# Patient Record
Sex: Female | Born: 1995 | Race: Black or African American | Hispanic: No | Marital: Single | Smoking: Never smoker
Health system: Southern US, Community
[De-identification: ages and names within clinical notes are randomized; demographics above are authoritative.]

## PROBLEM LIST (undated history)

## (undated) ENCOUNTER — Emergency Department: Discharge status: Absent without leave | Payer: Medicaid Other

## (undated) HISTORY — PX: NO PAST SURGERIES: SHX2092

---
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
## 2016-03-11 ENCOUNTER — Emergency Department: Payer: Self-pay

## 2016-03-11 ENCOUNTER — Encounter: Payer: Self-pay | Admitting: Emergency Medicine

## 2016-03-11 ENCOUNTER — Emergency Department
Admission: EM | Admit: 2016-03-11 | Discharge: 2016-03-12 | Disposition: A | Discharge status: Home / Self Care | Payer: Self-pay | Attending: Emergency Medicine | Admitting: Emergency Medicine

## 2016-03-11 DIAGNOSIS — R102 Pelvic and perineal pain: Secondary | ICD-10-CM

## 2016-03-11 DIAGNOSIS — B9689 Other specified bacterial agents as the cause of diseases classified elsewhere: Secondary | ICD-10-CM

## 2016-03-11 DIAGNOSIS — N760 Acute vaginitis: Secondary | ICD-10-CM | POA: Insufficient documentation

## 2016-03-11 LAB — URINALYSIS, COMPLETE (UACMP) WITH MICROSCOPIC
BILIRUBIN URINE: NEGATIVE
Bacteria, UA: NONE SEEN
GLUCOSE, UA: NEGATIVE mg/dL
HGB URINE DIPSTICK: NEGATIVE
Ketones, ur: 20 mg/dL — AB
LEUKOCYTES UA: NEGATIVE
NITRITE: NEGATIVE
PH: 5 (ref 5.0–8.0)
Protein, ur: 100 mg/dL — AB
SPECIFIC GRAVITY, URINE: 1.019 (ref 1.005–1.030)

## 2016-03-11 LAB — COMPREHENSIVE METABOLIC PANEL
ALT: 17 U/L (ref 14–54)
AST: 28 U/L (ref 15–41)
Albumin: 4.3 g/dL (ref 3.5–5.0)
Alkaline Phosphatase: 55 U/L (ref 38–126)
Anion gap: 9 (ref 5–15)
BILIRUBIN TOTAL: 1 mg/dL (ref 0.3–1.2)
BUN: 7 mg/dL (ref 6–20)
CHLORIDE: 103 mmol/L (ref 101–111)
CO2: 23 mmol/L (ref 22–32)
CREATININE: 0.64 mg/dL (ref 0.44–1.00)
Calcium: 9.3 mg/dL (ref 8.9–10.3)
GFR calc Af Amer: >60 mL/min (ref >60)
Glucose, Bld: 114 mg/dL — ABNORMAL HIGH (ref 65–99)
POTASSIUM: 3.5 mmol/L (ref 3.5–5.1)
Sodium: 135 mmol/L (ref 135–145)
TOTAL PROTEIN: 8 g/dL (ref 6.5–8.1)

## 2016-03-11 LAB — CBC
HCT: 33.4 % — ABNORMAL LOW (ref 35.0–47.0)
Hemoglobin: 11.5 g/dL — ABNORMAL LOW (ref 12.0–16.0)
MCH: 29.8 pg (ref 26.0–34.0)
MCHC: 34.4 g/dL (ref 32.0–36.0)
MCV: 86.8 fL (ref 80.0–100.0)
PLATELETS: 311 10*3/uL (ref 150–440)
RBC: 3.85 MIL/uL (ref 3.80–5.20)
RDW: 13.3 % (ref 11.5–14.5)
WBC: 12.4 10*3/uL — AB (ref 3.6–11.0)

## 2016-03-11 LAB — POCT PREGNANCY, URINE: PREG TEST UR: NEGATIVE

## 2016-03-11 LAB — LIPASE, BLOOD: LIPASE: 21 U/L (ref 11–51)

## 2016-03-11 MED ORDER — IOPAMIDOL (ISOVUE-300) INJECTION 61%
100.0000 mL | Freq: Once | INTRAVENOUS | Status: AC | PRN
  Administered 2016-03-11: 100 mL via INTRAVENOUS

## 2016-03-11 MED ORDER — ONDANSETRON HCL 4 MG/2ML IJ SOLN
4.0000 mg | Freq: Once | INTRAMUSCULAR | Status: DC
Start: 2016-03-11 — End: 2016-03-12

## 2016-03-11 MED ORDER — IOPAMIDOL (ISOVUE-300) INJECTION 61%
30.0000 mL | Freq: Once | INTRAVENOUS | Status: AC | PRN
  Administered 2016-03-11: 30 mL via INTRAVENOUS

## 2016-03-11 MED ORDER — KETOROLAC TROMETHAMINE 30 MG/ML IJ SOLN: 15.0000 mg | Freq: Once | INTRAMUSCULAR | Status: DC

## 2016-03-11 NOTE — ED Provider Notes (Signed)
Time Seen: Approximately 2148  I have reviewed the triage notes  Chief Complaint: Abdominal Pain   History of Present Illness: Alison Hall is a 21 y.o. female who presents with lower middle quadrant abdominal pain with some nausea and constipation over the past week. The patient states that she thought she might be pregnant. She has had some vaginal discharge which she describes as white and not atypical. She is not aware of any fever at home but did notice that the pain seemed to be worse when she was doing outside exercises. She denies any dysuria, hematuria or urinary frequency.   History reviewed. No pertinent past medical history.  There are no active problems to display for this patient.   History reviewed. No pertinent surgical history.  History reviewed. No pertinent surgical history.    Allergies:  Patient has no known allergies.  Family History: History reviewed. No pertinent family history.  Social History: Social History  Substance Use Topics  . Smoking status: Never Smoker  . Smokeless tobacco: Never Used  . Alcohol use No     Review of Systems:   10 point review of systems was performed and was otherwise negative:  Constitutional: No fever Eyes: No visual disturbances ENT: No sore throat, ear pain Cardiac: No chest pain Respiratory: No shortness of breath, wheezing, or stridor Abdomen:A she points mainly to the lower middle quadrant. Endocrine: No weight loss, No night sweats Extremities: No peripheral edema, cyanosis Skin: No rashes, easy bruising Neurologic: No focal weakness, trouble with speech or swollowing Urologic: No dysuria, Hematuria, or urinary frequency   Physical Exam:  ED Triage Vitals  Enc Vitals Group     BP 03/11/16 2028 116/66     Pulse Rate 03/11/16 2028 90     Resp 03/11/16 2028 16     Temp 03/11/16 2028 98 F (36.7 C)     Temp Source 03/11/16 2028 Oral     SpO2 03/11/16 2028 98 %     Weight 03/11/16 2028 182  lb (82.6 kg)     Height 03/11/16 2028 5\' 4"  (1.626 m)     Head Circumference --      Peak Flow --      Pain Score 03/11/16 2200 8     Pain Loc --      Pain Edu? --      Excl. in GC? --     General: Awake , Alert , and Oriented times 3; GCS 15 Head: Normal cephalic , atraumatic Eyes: Pupils equal , round, reactive to light Nose/Throat: No nasal drainage, patent upper airway without erythema or exudate.  Neck: Supple, Full range of motion, No anterior adenopathy or palpable thyroid masses Lungs: Clear to ascultation without wheezes , rhonchi, or rales Heart: Regular rate, regular rhythm without murmurs , gallops , or rubs Abdomen: Mild tenderness over the lower middle quadrant without rebound, guarding or rigidity .        Extremities: 2 plus symmetric pulses. No edema, clubbing or cyanosis Neurologic: normal ambulation, Motor symmetric without deficits, sensory intact Skin: warm, dry, no rashes Awake exam with chaperone present shows no cervical motion tenderness with a white to clear vaginal discharge. No purulence is noted wet prep and GC and chlamydia were obtained  Labs:   All laboratory work was reviewed including any pertinent negatives or positives listed below:  Labs Reviewed  COMPREHENSIVE METABOLIC PANEL - Abnormal; Notable for the following:       Result Value  Glucose, Bld 114 (*)    All other components within normal limits  CBC - Abnormal; Notable for the following:    WBC 12.4 (*)    Hemoglobin 11.5 (*)    HCT 33.4 (*)    All other components within normal limits  URINALYSIS, COMPLETE (UACMP) WITH MICROSCOPIC - Abnormal; Notable for the following:    Color, Urine YELLOW (*)    APPearance CLEAR (*)    Ketones, ur 20 (*)    Protein, ur 100 (*)    Squamous Epithelial / LPF 0-5 (*)    All other components within normal limits  CHLAMYDIA/NGC RT PCR ([facility] ONLY)  WET PREP, GENITAL  LIPASE, BLOOD  POC URINE PREG, ED  POCT PREGNANCY, URINE     Radiology: "Ct Abdomen Pelvis W Contrast  Result Date: 03/11/2016 CLINICAL DATA:  Left lower quadrant abdominal pain for 1 week. Nausea and constipation. EXAM: CT ABDOMEN AND PELVIS WITH CONTRAST TECHNIQUE: Multidetector CT imaging of the abdomen and pelvis was performed using the standard protocol following bolus administration of intravenous contrast. CONTRAST:  100mL ISOVUE-300 IOPAMIDOL (ISOVUE-300) INJECTION 61% COMPARISON:  None. FINDINGS: Lower chest: Lung bases are clear. Hepatobiliary: No focal liver abnormality is seen. No gallstones, gallbladder wall thickening, or biliary dilatation. Pancreas: Unremarkable. No pancreatic ductal dilatation or surrounding inflammatory changes. Spleen: Normal in size without focal abnormality. Adrenals/Urinary Tract: No adrenal gland nodules. Kidneys are symmetrical. Homogeneous nephrograms. No hydronephrosis or hydroureter. Bladder wall is not thickened. No bladder filling defects. Stomach/Bowel: Stomach, small bowel, and colon are not abnormally distended and no wall thickening is appreciated. Contrast material flows through to the colon without evidence of bowel obstruction. Appendix is not identified. Vascular/Lymphatic: No significant vascular findings are present. No enlarged abdominal or pelvic lymph nodes. Reproductive: Uterus and bilateral adnexa are unremarkable. Other: No abdominal wall hernia or abnormality. No abdominopelvic ascites. Musculoskeletal: No acute or significant osseous findings. IMPRESSION: No acute process demonstrated in the abdomen or pelvis. No evidence of bowel obstruction or inflammation. Electronically Signed   By: Burman NievesWilliam  Stevens M.D.   On: 03/11/2016 23:48  "  I personally reviewed the radiologic studies     ED Course:  Patient's stay here was uneventful and she seemed to have significant relief when advised that her pregnancy test was negative. She does not have any peritoneal signs on exam though her pain seemed to be  worse with movement and she has a slightly elevated non-shifted white blood cell count. Mental CT shows no acute process is noted and there is no findings indicating a surgical abdomen. The patient's wet prep is pending at this time though I did not have a strong clinical suspicion for gonorrhea or chlamydia or other sexually transmitted diseases     Assessment:  Acute unspecified lower middle abdominal pain      Plan:  Outpatient Patient was advised to return immediately if condition worsens. Patient was advised to follow up with their primary care physician or other specialized physicians involved in their outpatient care. The patient and/or family member/power of attorney had laboratory results reviewed at the bedside. All questions and concerns were addressed and appropriate discharge instructions were distributed by the nursing staff.             Jennye MoccasinBrian S Quigley, MD 03/11/16 [id number]

## 2016-03-11 NOTE — ED Triage Notes (Signed)
Pt ambulatory to triage with steady gait, no distress noted. Pt c/o LQ abdominal pain x1 week, nausea and constipation accompanying symptoms. Pt sts "I am scared I may be pregnant but I have been to scared to take a test."

## 2016-03-12 LAB — CHLAMYDIA/NGC RT PCR (ARMC ONLY)
CHLAMYDIA TR: NOT DETECTED
N gonorrhoeae: NOT DETECTED

## 2016-03-12 LAB — WET PREP, GENITAL
Sperm: NONE SEEN
TRICH WET PREP: NONE SEEN
YEAST WET PREP: NONE SEEN

## 2016-03-12 MED ORDER — METRONIDAZOLE 500 MG PO TABS: 500.0000 mg | ORAL_TABLET | Freq: Two times a day (BID) | ORAL | 0 refills | Status: DC

## 2016-03-12 NOTE — ED Provider Notes (Signed)
-----------------------------------------   12:42 AM on 03/12/2016 -----------------------------------------  Wet prep is positive for clue cells. GC/Chlamydia pending; previous provider did not suspect STDs.  CT abdomen/pelvis interpreted per Dr. Andria MeuseStevens: No acute process demonstrated in the abdomen or pelvis. No evidence  of bowel obstruction or inflammation.   Updated patient of results. Will place her on Flagyl and she will follow up with GYN. Strict return precautions given. Patient verbalizes understanding and agrees with plan of care.   Irean HongJade J Zoraya Fiorenza, MD 03/12/16 [phone]

## 2016-03-12 NOTE — Discharge Instructions (Signed)
1. Take antibiotic as prescribed (Flagyl 500 mg twice daily ×7 days). °2. Return to the ER for worsening symptoms, persistent vomiting, difficulty breathing or other concerns. °

## 2016-10-09 ENCOUNTER — Emergency Department
Admission: EM | Admit: 2016-10-09 | Discharge: 2016-10-09 | Disposition: A | Discharge status: Home / Self Care | Payer: Self-pay | Attending: Student in an Organized Health Care Education/Training Program | Admitting: Student in an Organized Health Care Education/Training Program

## 2016-10-09 ENCOUNTER — Encounter: Payer: Self-pay | Admitting: Emergency Medicine

## 2016-10-09 DIAGNOSIS — R509 Fever, unspecified: Secondary | ICD-10-CM

## 2016-10-09 DIAGNOSIS — R51 Headache: Secondary | ICD-10-CM

## 2016-10-09 DIAGNOSIS — R519 Headache, unspecified: Secondary | ICD-10-CM

## 2016-10-09 DIAGNOSIS — N12 Tubulo-interstitial nephritis, not specified as acute or chronic: Secondary | ICD-10-CM | POA: Insufficient documentation

## 2016-10-09 DIAGNOSIS — Z79899 Other long term (current) drug therapy: Secondary | ICD-10-CM | POA: Insufficient documentation

## 2016-10-09 LAB — URINALYSIS, COMPLETE (UACMP) WITH MICROSCOPIC
BILIRUBIN URINE: NEGATIVE
Glucose, UA: NEGATIVE mg/dL
KETONES UR: 80 mg/dL — AB
NITRITE: NEGATIVE
PH: 6 (ref 5.0–8.0)
PROTEIN: 100 mg/dL — AB
Specific Gravity, Urine: 1.024 (ref 1.005–1.030)

## 2016-10-09 LAB — COMPREHENSIVE METABOLIC PANEL
ALT: 20 U/L (ref 14–54)
AST: 35 U/L (ref 15–41)
Albumin: 3.8 g/dL (ref 3.5–5.0)
Alkaline Phosphatase: 64 U/L (ref 38–126)
Anion gap: 9 (ref 5–15)
BUN: 9 mg/dL (ref 6–20)
CALCIUM: 9.2 mg/dL (ref 8.9–10.3)
CHLORIDE: 102 mmol/L (ref 101–111)
CO2: 25 mmol/L (ref 22–32)
CREATININE: 1.01 mg/dL — AB (ref 0.44–1.00)
Glucose, Bld: 106 mg/dL — ABNORMAL HIGH (ref 65–99)
Potassium: 3.6 mmol/L (ref 3.5–5.1)
Sodium: 136 mmol/L (ref 135–145)
Total Bilirubin: 1.9 mg/dL — ABNORMAL HIGH (ref 0.3–1.2)
Total Protein: 8.1 g/dL (ref 6.5–8.1)

## 2016-10-09 LAB — CBC WITH DIFFERENTIAL/PLATELET
BASOS PCT: 0 %
Basophils Absolute: 0 10*3/uL (ref 0–0.1)
Eosinophils Absolute: 0 10*3/uL (ref 0–0.7)
Eosinophils Relative: 0 %
HCT: 36.8 % (ref 35.0–47.0)
HEMOGLOBIN: 12.6 g/dL (ref 12.0–16.0)
LYMPHS ABS: 1.1 10*3/uL (ref 1.0–3.6)
Lymphocytes Relative: 14 %
MCH: 30.1 pg (ref 26.0–34.0)
MCHC: 34.1 g/dL (ref 32.0–36.0)
MCV: 88.1 fL (ref 80.0–100.0)
Monocytes Absolute: 0.9 10*3/uL (ref 0.2–0.9)
Monocytes Relative: 12 %
NEUTROS PCT: 74 %
Neutro Abs: 5.5 10*3/uL (ref 1.4–6.5)
Platelets: 180 10*3/uL (ref 150–440)
RBC: 4.18 MIL/uL (ref 3.80–5.20)
RDW: 13.2 % (ref 11.5–14.5)
WBC: 7.4 10*3/uL (ref 3.6–11.0)

## 2016-10-09 LAB — PREGNANCY, URINE: Preg Test, Ur: NEGATIVE

## 2016-10-09 LAB — LIPASE, BLOOD: Lipase: 17 U/L (ref 11–51)

## 2016-10-09 LAB — LACTIC ACID, PLASMA: LACTIC ACID, VENOUS: 1.1 mmol/L (ref 0.5–1.9)

## 2016-10-09 MED ORDER — CEPHALEXIN 500 MG PO CAPS: 500.0000 mg | ORAL_CAPSULE | Freq: Three times a day (TID) | ORAL | 0 refills | Status: AC

## 2016-10-09 MED ORDER — CEPHALEXIN 500 MG PO CAPS
500.0000 mg | ORAL_CAPSULE | Freq: Once | ORAL | Status: AC
  Administered 2016-10-09: 500 mg via ORAL
  Filled 2016-10-09: qty 1

## 2016-10-09 MED ORDER — SODIUM CHLORIDE 0.9 % IV BOLUS (SEPSIS)
1000.0000 mL | Freq: Once | INTRAVENOUS | Status: AC
  Administered 2016-10-09: 1000 mL via INTRAVENOUS

## 2016-10-09 MED ORDER — PROCHLORPERAZINE EDISYLATE 5 MG/ML IJ SOLN
10.0000 mg | Freq: Once | INTRAMUSCULAR | Status: AC
  Administered 2016-10-09: 10 mg via INTRAVENOUS
  Filled 2016-10-09: qty 2

## 2016-10-09 MED ORDER — ACETAMINOPHEN 325 MG PO TABS
650.0000 mg | ORAL_TABLET | Freq: Once | ORAL | Status: AC
  Administered 2016-10-09: 650 mg via ORAL
  Filled 2016-10-09: qty 2

## 2016-10-09 NOTE — ED Provider Notes (Signed)
[facility] Emergency Department Provider Note    First MD Initiated Contact with Patient 10/09/16 1802     (approximate)  I have reviewed the triage vital signs and the nursing notes.   HISTORY  Chief Complaint Headache    HPI Alison Hall is a 21 y.o. female assessment chief complaint of 3 days of mild headache and myalgias. Has not had any measured fevers but was febrile in triage today. States that she has been having some left flank pain and dysuria starting 1 week ago.no recent travel. Denies any chance of being pregnant. Denies any vaginal discharge. No history of sexually transmitted infections. No cough. No sore throat. No blurry vision or photophobia.   History reviewed. No pertinent past medical history. History reviewed. No pertinent family history. History reviewed. No pertinent surgical history. There are no active problems to display for this patient.     Prior to Admission medications   Medication Sig Start Date End Date Taking? Authorizing Provider  metroNIDAZOLE (FLAGYL) 500 MG tablet Take 1 tablet (500 mg total) by mouth 2 (two) times daily. Patient not taking: Reported on 10/09/2016 03/12/16   Irean Hong, MD    Allergies Patient has no known allergies.    Social History Social History  Substance Use Topics  . Smoking status: Never Smoker  . Smokeless tobacco: Never Used  . Alcohol use No    Review of Systems Patient denies headaches, rhinorrhea, blurry vision, numbness, shortness of breath, chest pain, edema, cough, abdominal pain, nausea, vomiting, diarrhea, dysuria, fevers, rashes or hallucinations unless otherwise stated above in HPI. ____________________________________________   PHYSICAL EXAM:  VITAL SIGNS: Vitals:   10/09/16 1715  BP: 108/61  Pulse: (!) 105  Resp: 18  Temp: (!) 102 F (38.9 C)  SpO2: 99%    Constitutional: Alert and oriented. Well appearing and in no acute distress. Eyes:  Conjunctivae are normal.  Head: Atraumatic. Nose: No congestion/rhinnorhea. Mouth/Throat: Mucous membranes are moist.   Neck: No stridor. Painless Hall.  Cardiovascular: Normal rate, regular rhythm. Grossly normal heart sounds.  Good peripheral circulation. Respiratory: Normal respiratory effort.  No retractions. Lungs CTAB. Gastrointestinal: Soft and nontender. No distention. No abdominal bruits. + left CVA tenderness. Musculoskeletal: No lower extremity tenderness nor edema.  No joint effusions. Neurologic:  Normal speech and language. No gross focal neurologic deficits are appreciated. No facial droop Skin:  Skin is warm, dry and intact. No rash noted. Psychiatric: Mood and affect are normal. Speech and behavior are normal.  ____________________________________________   LABS (all labs ordered are listed, but only abnormal results are displayed)  Results for orders placed or performed during the hospital encounter of 10/09/16 (from the past 24 hour(s))  Lactic acid, plasma     Status: None   Collection Time: 10/09/16  5:16 PM  Result Value Ref Range   Lactic Acid, Venous 1.1 0.5 - 1.9 mmol/L  Comprehensive metabolic panel     Status: Abnormal   Collection Time: 10/09/16  5:16 PM  Result Value Ref Range   Sodium 136 135 - 145 mmol/L   Potassium 3.6 3.5 - 5.1 mmol/L   Chloride 102 101 - 111 mmol/L   CO2 25 22 - 32 mmol/L   Glucose, Bld 106 (H) 65 - 99 mg/dL   BUN 9 6 - 20 mg/dL   Creatinine, Ser 4.09 (H) 0.44 - 1.00 mg/dL   Calcium 9.2 8.9 - 81.1 mg/dL   Total Protein 8.1 6.5 - 8.1 g/dL  Albumin 3.8 3.5 - 5.0 g/dL   AST 35 15 - 41 U/L   ALT 20 14 - 54 U/L   Alkaline Phosphatase 64 38 - 126 U/L   Total Bilirubin 1.9 (H) 0.3 - 1.2 mg/dL   GFR calc non Af Amer >60 >60 mL/min   GFR calc Af Amer >60 >60 mL/min   Anion gap 9 5 - 15  CBC with Differential     Status: None   Collection Time: 10/09/16  5:16 PM  Result Value Ref Range   WBC 7.4 3.6 - 11.0 K/uL   RBC 4.18 3.80 -  5.20 MIL/uL   Hemoglobin 12.6 12.0 - 16.0 g/dL   HCT 16.1 09.6 - 04.5 %   MCV 88.1 80.0 - 100.0 fL   MCH 30.1 26.0 - 34.0 pg   MCHC 34.1 32.0 - 36.0 g/dL   RDW 40.9 81.1 - 91.4 %   Platelets 180 150 - 440 K/uL   Neutrophils Relative % 74 %   Neutro Abs 5.5 1.4 - 6.5 K/uL   Lymphocytes Relative 14 %   Lymphs Abs 1.1 1.0 - 3.6 K/uL   Monocytes Relative 12 %   Monocytes Absolute 0.9 0.2 - 0.9 K/uL   Eosinophils Relative 0 %   Eosinophils Absolute 0.0 0 - 0.7 K/uL   Basophils Relative 0 %   Basophils Absolute 0.0 0 - 0.1 K/uL  Lipase, blood     Status: None   Collection Time: 10/09/16  5:22 PM  Result Value Ref Range   Lipase 17 11 - 51 U/L  Urinalysis, Complete w Microscopic     Status: Abnormal   Collection Time: 10/09/16  7:17 PM  Result Value Ref Range   Color, Urine AMBER (A) YELLOW   APPearance HAZY (A) CLEAR   Specific Gravity, Urine 1.024 1.005 - 1.030   pH 6.0 5.0 - 8.0   Glucose, UA NEGATIVE NEGATIVE mg/dL   Hgb urine dipstick MODERATE (A) NEGATIVE   Bilirubin Urine NEGATIVE NEGATIVE   Ketones, ur 80 (A) NEGATIVE mg/dL   Protein, ur 782 (A) NEGATIVE mg/dL   Nitrite NEGATIVE NEGATIVE   Leukocytes, UA SMALL (A) NEGATIVE   RBC / HPF 6-30 0 - 5 RBC/hpf   WBC, UA TOO NUMEROUS TO COUNT 0 - 5 WBC/hpf   Bacteria, UA RARE (A) NONE SEEN   Squamous Epithelial / LPF 0-5 (A) NONE SEEN   Mucus PRESENT   Pregnancy, urine     Status: None   Collection Time: 10/09/16  7:17 PM  Result Value Ref Range   Preg Test, Ur NEGATIVE NEGATIVE   ____________________________________________ ____________________________________________  RADIOLOGY  I personally reviewed all radiographic images ordered to evaluate for the above acute complaints and reviewed radiology reports and findings.  These findings were personally discussed with the patient.  Please see medical record for radiology report.  ____________________________________________   PROCEDURES  Procedure(s) performed:    Procedures    Critical Care performed: no ____________________________________________   INITIAL IMPRESSION / ASSESSMENT AND PLAN / ED COURSE  Pertinent labs & imaging results that were available during my care of the patient were reviewed by me and considered in my medical decision making (see chart for details).  DDX: pyelo, uti, sti, strep throat, unlikely meningitis  Alison Hall is a 21 y.o. who presents to the ED with headache dysuria or flank pain. Patient is febrile mildly tachycardic but very well perfused and well appearing. Patient smiling in ER room and walked in without  any distress. She has no meningismus or focal neurodeficits. Her abdominal exam is soft and benign. Do not believe this is clinically consistent with appendicitis or acute intra-abdominal process however I am concerned for pyelonephritis. Will provide IV fluids headache cocktail and blood work for the above differential.  Clinical Course as of Oct 10 2002  Fri Oct 09, 2016  1957 patient reassessed. States headache has resolved. Based on her urinalysis and do believe this is most consistent with urinary tract infection. Repeat abdominal exam is soft and benign. She does have some flank tenderness will treat this as a pyelonephritis with Keflex. Discussed strict return precautions. Patient able to tolerate oral hydration and is otherwise asymptomatic at this time.  [PR]    Clinical Course User Index [PR] Willy Eddy, MD     ____________________________________________   FINAL CLINICAL IMPRESSION(S) / ED DIAGNOSES  Final diagnoses:  Fever, unspecified fever cause  Pyelonephritis  Acute nonintractable headache, unspecified headache type      NEW MEDICATIONS STARTED DURING THIS VISIT:  New Prescriptions   No medications on file     Note:  This document was prepared using Dragon voice recognition software and may include unintentional dictation errors.    Willy Eddy,  MD 10/09/16 2004

## 2016-10-09 NOTE — ED Triage Notes (Addendum)
Pt c/o headache for 3 days that has been constant.  Head hurts when turns neck but not really pain with extension/flexion.  Fever in triage.  Has also had some lower left abdominal pain.  Ambulatory to triage. alert and oriented X3.  NAD. Respirations unlabored. Is texting on phone looking down.   Mask applied

## 2016-10-09 NOTE — Discharge Instructions (Signed)
Return immediately for worsening pain, inability to tolerate antibiotics or for any other concerns or questions.

## 2017-04-14 ENCOUNTER — Encounter: Payer: Self-pay | Admitting: Advanced Practice Midwife

## 2019-04-20 ENCOUNTER — Ambulatory Visit: Payer: Self-pay | Attending: Family

## 2019-04-20 DIAGNOSIS — Z23 Encounter for immunization: Secondary | ICD-10-CM

## 2019-04-20 NOTE — Progress Notes (Signed)
   Covid-19 Vaccination Clinic  Name:  Alison Hall    MRN: [id number] DOB: 10-Feb-1995  04/20/2019  Ms. Gathright was observed post Covid-19 immunization for 15 minutes without incident. She was provided with Vaccine Information Sheet and instruction to access the V-Safe system.   Ms. Melder was instructed to call 911 with any severe reactions post vaccine: Marland Kitchen Difficulty breathing  . Swelling of face and throat  . A fast heartbeat  . A bad rash all over body  . Dizziness and weakness   Immunizations Administered    Name Date Dose VIS Date Route   Moderna COVID-19 Vaccine 04/20/2019  5:39 PM 0.5 mL 12/27/2018 Intramuscular   Manufacturer: Moderna   Lot: [id number]   NDC: 37543-606-77

## 2019-05-23 ENCOUNTER — Ambulatory Visit: Payer: Medicaid Other | Attending: Family

## 2019-05-23 ENCOUNTER — Ambulatory Visit: Payer: Self-pay

## 2019-05-23 DIAGNOSIS — Z23 Encounter for immunization: Secondary | ICD-10-CM

## 2019-05-23 NOTE — Progress Notes (Signed)
   Covid-19 Vaccination Clinic  Name:  LAIKYNN POLLIO    MRN: [id number] DOB: 1995/10/25  05/23/2019  Ms. Hallberg was observed post Covid-19 immunization for 15 minutes without incident. She was provided with Vaccine Information Sheet and instruction to access the V-Safe system.   Ms. Mensinger was instructed to call 911 with any severe reactions post vaccine: Marland Kitchen Difficulty breathing  . Swelling of face and throat  . A fast heartbeat  . A bad rash all over body  . Dizziness and weakness   Immunizations Administered    Name Date Dose VIS Date Route   Moderna COVID-19 Vaccine 05/23/2019  4:14 PM 0.5 mL 12/2018 Intramuscular   Manufacturer: Moderna   Lot: [id number]   NDC: 20910-681-66

## 2019-08-05 ENCOUNTER — Other Ambulatory Visit: Payer: Self-pay

## 2019-08-05 ENCOUNTER — Emergency Department
Admission: EM | Admit: 2019-08-05 | Discharge: 2019-08-05 | Disposition: A | Discharge status: Home / Self Care | Payer: BC Managed Care – PPO | Attending: Emergency Medicine | Admitting: Emergency Medicine

## 2019-08-05 DIAGNOSIS — S0083XA Contusion of other part of head, initial encounter: Secondary | ICD-10-CM

## 2019-08-05 DIAGNOSIS — W268XXA Contact with other sharp object(s), not elsewhere classified, initial encounter: Secondary | ICD-10-CM | POA: Insufficient documentation

## 2019-08-05 DIAGNOSIS — S0181XA Laceration without foreign body of other part of head, initial encounter: Secondary | ICD-10-CM

## 2019-08-05 DIAGNOSIS — S0993XA Unspecified injury of face, initial encounter: Secondary | ICD-10-CM | POA: Diagnosis present

## 2019-08-05 DIAGNOSIS — S01112A Laceration without foreign body of left eyelid and periocular area, initial encounter: Secondary | ICD-10-CM | POA: Insufficient documentation

## 2019-08-05 DIAGNOSIS — Y999 Unspecified external cause status: Secondary | ICD-10-CM | POA: Diagnosis not present

## 2019-08-05 DIAGNOSIS — Y92009 Unspecified place in unspecified non-institutional (private) residence as the place of occurrence of the external cause: Secondary | ICD-10-CM | POA: Diagnosis not present

## 2019-08-05 DIAGNOSIS — Z79899 Other long term (current) drug therapy: Secondary | ICD-10-CM | POA: Diagnosis not present

## 2019-08-05 DIAGNOSIS — Y939 Activity, unspecified: Secondary | ICD-10-CM | POA: Insufficient documentation

## 2019-08-05 MED ORDER — LIDOCAINE-EPINEPHRINE-TETRACAINE (LET) TOPICAL GEL
3.0000 mL | Freq: Once | TOPICAL | Status: AC
  Administered 2019-08-05: 3 mL via TOPICAL
  Filled 2019-08-05: qty 3
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
## 2019-08-05 NOTE — ED Notes (Signed)
See triage note, pt is ambulatory to the room in no acute distress with small laceration below left eye with bruising.

## 2019-08-05 NOTE — Discharge Instructions (Addendum)
You may shower but do not submerge Dermabond underwater.  Do not place any ointments or creams over the glue.  Allow Dermabond to come off on its own, if still intact in 10 days, you may remove Dermabond.  Return to the ER for any warmth redness swelling or drainage.  Please continue with ice to minimize swelling.
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
## 2019-09-27 ENCOUNTER — Ambulatory Visit
Admission: EM | Admit: 2019-09-27 | Discharge: 2019-09-27 | Disposition: A | Discharge status: Home / Self Care | Payer: PRIVATE HEALTH INSURANCE | Attending: Family Medicine | Admitting: Family Medicine

## 2019-09-27 DIAGNOSIS — R112 Nausea with vomiting, unspecified: Secondary | ICD-10-CM | POA: Diagnosis not present

## 2019-09-27 DIAGNOSIS — R197 Diarrhea, unspecified: Secondary | ICD-10-CM | POA: Diagnosis not present

## 2019-09-27 DIAGNOSIS — Z1152 Encounter for screening for COVID-19: Secondary | ICD-10-CM

## 2019-09-27 DIAGNOSIS — R52 Pain, unspecified: Secondary | ICD-10-CM | POA: Diagnosis not present

## 2019-09-27 DIAGNOSIS — K529 Noninfective gastroenteritis and colitis, unspecified: Secondary | ICD-10-CM

## 2019-09-27 MED ORDER — ONDANSETRON HCL 4 MG PO TABS: 4.0000 mg | ORAL_TABLET | Freq: Four times a day (QID) | ORAL | 0 refills | Status: DC

## 2019-09-27 MED ORDER — LOPERAMIDE HCL 2 MG PO CAPS: 2.0000 mg | ORAL_CAPSULE | Freq: Four times a day (QID) | ORAL | 0 refills | Status: AC | PRN

## 2019-09-27 NOTE — ED Triage Notes (Signed)
Pt presents with diarrhea, nausea, abdominal discomfort and vomiting x 3 days. Denies fever, SOB.

## 2019-09-27 NOTE — ED Provider Notes (Signed)
[facility]   [id number] 09/27/19 Arrival Time: 1015   CC: COVID symptoms  SUBJECTIVE: History from: patient.  Alison Hall is a 24 y.o. female who presents with abrupt onset of nasal congestion, PND, nausea, vomiting, diarrhea for the last 3 days. Denies sick exposure to COVID, flu or strep. Denies recent travel. Has negative history of Covid. Has completed Covid vaccines. Has not taken OTC medications for this. There are no aggravating or alleviating factors. Denies previous symptoms in the past. Denies fever, chills, fatigue, sinus pain, rhinorrhea, sore throat, SOB, wheezing, chest pain, changes in bowel or bladder habits.    ROS: As per HPI.  All other pertinent ROS negative.     No past medical history on file. No past surgical history on file. No Known Allergies No current facility-administered medications on file prior to encounter.   Current Outpatient Medications on File Prior to Encounter  Medication Sig Dispense Refill   metroNIDAZOLE (FLAGYL) 500 MG tablet Take 1 tablet (500 mg total) by mouth 2 (two) times daily. (Patient not taking: Reported on 10/09/2016) 14 tablet 0   Social History   Socioeconomic History   Marital status: Single    Spouse name: Not on file   Number of children: Not on file   Years of education: Not on file   Highest education level: Not on file  Occupational History   Not on file  Tobacco Use   Smoking status: Never Smoker   Smokeless tobacco: Never Used  Substance and Sexual Activity   Alcohol use: No   Drug use: No   Sexual activity: Not on file  Other Topics Concern   Not on file  Social History Narrative   Not on file   Social Determinants of Health   Financial Resource Strain:    Difficulty of Paying Living Expenses: Not on file  Food Insecurity:    Worried About Running Out of Food in the Last Year: Not on file   Ran Out of Food in the Last Year: Not on file  Transportation Needs:    Lack of  Transportation (Medical): Not on file   Lack of Transportation (Non-Medical): Not on file  Physical Activity:    Days of Exercise per Week: Not on file   Minutes of Exercise per Session: Not on file  Stress:    Feeling of Stress : Not on file  Social Connections:    Frequency of Communication with Friends and Family: Not on file   Frequency of Social Gatherings with Friends and Family: Not on file   Attends Religious Services: Not on file   Active Member of Clubs or Organizations: Not on file   Attends Banker Meetings: Not on file   Marital Status: Not on file  Intimate Partner Violence:    Fear of Current or Ex-Partner: Not on file   Emotionally Abused: Not on file   Physically Abused: Not on file   Sexually Abused: Not on file   No family history on file.  OBJECTIVE:  Vitals:   09/27/19 1021  BP: 106/75  Pulse: 85  Resp: 17  Temp: 98.1 F (36.7 C)  TempSrc: Oral  SpO2: 97%     General appearance: alert; appears fatigued, but nontoxic; speaking in full sentences and tolerating own secretions HEENT: NCAT; Ears: EACs clear, TMs pearly gray; Eyes: PERRL.  EOM grossly intact. Sinuses: nontender; Nose: nares patent without rhinorrhea, Throat: oropharynx clear, tonsils non erythematous or enlarged, uvula midline  Neck: supple  without LAD Lungs: unlabored respirations, symmetrical air entry; cough: absent; no respiratory distress; CTAB Heart: regular rate and rhythm.  Radial pulses 2+ symmetrical bilaterally Skin: warm and dry Psychological: alert and cooperative; normal mood and affect  LABS:  No results found for this or any previous visit (from the past 24 hour(s)).   ASSESSMENT & PLAN:  1. Noninfectious gastroenteritis, unspecified type   2. Body aches   3. Nausea and vomiting, intractability of vomiting not specified, unspecified vomiting type   4. Diarrhea, unspecified type   5. Aches   6. Encounter for screening for COVID-19      Meds ordered this encounter  Medications   ondansetron (ZOFRAN) 4 MG tablet    Sig: Take 1 tablet (4 mg total) by mouth every 6 (six) hours.    Dispense:  12 tablet    Refill:  0    Order Specific Question:   Supervising Provider    Answer:   Merrilee Jansky [id number]   loperamide (IMODIUM) 2 MG capsule    Sig: Take 1 capsule (2 mg total) by mouth 4 (four) times daily as needed for diarrhea or loose stools.    Dispense:  12 capsule    Refill:  0    Order Specific Question:   Supervising Provider    Answer:   Merrilee Jansky [id number]    Prescribed zofran for nausea Prescribed immodium for diarrhea COVID testing ordered.  It will take between 1-2 days for test results.  Someone will contact you regarding abnormal results.    Patient should remain in quarantine until they have received Covid results.  If negative you may resume normal activities (go back to work/school) while practicing hand hygiene, social distance, and mask wearing.  If positive, patient should remain in quarantine for 10 days from symptom onset AND greater than 72 hours after symptoms resolution (absence of fever without the use of fever-reducing medication and improvement in respiratory symptoms), whichever is longer Get plenty of rest and push fluids Use OTC zyrtec for nasal congestion, runny nose, and/or sore throat Use OTC flonase for nasal congestion and runny nose Use medications daily for symptom relief Use OTC medications like ibuprofen or tylenol as needed fever or pain Call or go to the ED if you have any new or worsening symptoms such as fever, worsening cough, shortness of breath, chest tightness, chest pain, turning blue, changes in mental status.  Reviewed expectations re: course of current medical issues. Questions answered. Outlined signs and symptoms indicating need for more acute intervention. Patient verbalized understanding. After Visit Summary given.         [name],  NP 09/27/19 1054

## 2019-09-27 NOTE — Discharge Instructions (Addendum)
Your COVID test is pending.  You should self quarantine until the test result is back.    Loperamide for diarrhea  Zofran for nausea  Take Tylenol as needed for fever or discomfort.  Rest and keep yourself hydrated.    Go to the emergency department if you develop acute worsening symptoms.

## 2019-09-29 LAB — NOVEL CORONAVIRUS, NAA: SARS-CoV-2, NAA: NOT DETECTED

## 2019-10-04 ENCOUNTER — Ambulatory Visit (INDEPENDENT_AMBULATORY_CARE_PROVIDER_SITE_OTHER): Payer: PRIVATE HEALTH INSURANCE

## 2019-10-04 ENCOUNTER — Encounter: Payer: Self-pay | Admitting: Emergency Medicine

## 2019-10-04 ENCOUNTER — Ambulatory Visit
Admission: EM | Admit: 2019-10-04 | Discharge: 2019-10-04 | Disposition: A | Discharge status: Home / Self Care | Payer: PRIVATE HEALTH INSURANCE | Attending: Family Medicine | Admitting: Family Medicine

## 2019-10-04 ENCOUNTER — Other Ambulatory Visit: Payer: Self-pay

## 2019-10-04 DIAGNOSIS — M25562 Pain in left knee: Secondary | ICD-10-CM

## 2019-10-04 DIAGNOSIS — S83102A Unspecified subluxation of left knee, initial encounter: Secondary | ICD-10-CM | POA: Diagnosis not present

## 2019-10-04 DIAGNOSIS — M25462 Effusion, left knee: Secondary | ICD-10-CM

## 2019-10-04 MED ORDER — NAPROXEN 500 MG PO TABS: 500.0000 mg | ORAL_TABLET | Freq: Two times a day (BID) | ORAL | 0 refills | Status: AC

## 2019-10-04 NOTE — ED Provider Notes (Signed)
[facility]    CSN: [id number] Arrival date & time: 10/04/19  1716      History   Chief Complaint Chief Complaint  Patient presents with  . Knee Pain    left    HPI Alison Hall is a 24 y.o. female.   24 year old female presents for 2-week history of left knee pain and swelling that has been gradually worsening.  She denies any known inciting event.  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  Denies any work-related injury.  She says pain is up to about a 9 out of 10 currently.  She has been elevating the knee.  She has not taken anything for pain or ice the knee.  She denies any numbness tingling or weakness.  She denies any prior history of knee injury, surgery, ligament tears, etc.  She says the knee pain is all over the anterior knee and radiates down the medial lower leg.  Pain is worse with flexion and ambulation.  Denies falls.  Denies any other complaints.     History reviewed. No pertinent past medical history.  There are no problems to display for this patient.   Past Surgical History:  Procedure Laterality Date  . NO PAST SURGERIES      OB History   No obstetric history on file.      Home Medications    Prior to Admission medications   Medication Sig Start Date End Date Taking? Authorizing Provider  loperamide (IMODIUM) 2 MG capsule Take 1 capsule (2 mg total) by mouth 4 (four) times daily as needed for diarrhea or loose stools. 09/27/19   [name], NP  metroNIDAZOLE (FLAGYL) 500 MG tablet Take 1 tablet (500 mg total) by mouth 2 (two) times daily. Patient not taking: Reported on 10/09/2016 03/12/16   Irean Hong, MD  naproxen (NAPROSYN) 500 MG tablet Take 1 tablet (500 mg total) by mouth 2 (two) times daily for 15 days. 10/04/19 10/19/19  Eusebio Friendly B, PA-C  ondansetron (ZOFRAN) 4 MG tablet Take 1 tablet (4 mg total) by mouth every 6 (six) hours. 09/27/19   [name], NP    Family History Family History  Problem Relation  Age of Onset  . Diabetes Mother   . Other Father        unknown medical history    Social History Social History   Tobacco Use  . Smoking status: Never Smoker  . Smokeless tobacco: Never Used  Vaping Use  . Vaping Use: Never used  Substance Use Topics  . Alcohol use: No  . Drug use: No     Allergies   Patient has no known allergies.   Review of Systems Review of Systems  Constitutional: Negative for fatigue and fever.  Musculoskeletal: Positive for arthralgias, gait problem and joint swelling. Negative for myalgias.  Skin: Negative for rash and wound.  Neurological: Negative for weakness and numbness.     Physical Exam Triage Vital Signs ED Triage Vitals  Enc Vitals Group     BP 10/04/19 1805 108/72     Pulse Rate 10/04/19 1805 80     Resp 10/04/19 1805 18     Temp 10/04/19 1805 98.4 F (36.9 C)     Temp Source 10/04/19 1805 Oral     SpO2 10/04/19 1805 100 %     Weight 10/04/19 1806 228 lb (103.4 kg)     Height 10/04/19 1806 5' 4.5" (1.638 m)     Head Circumference --  Peak Flow --      Pain Score 10/04/19 1804 9     Pain Loc --      Pain Edu? --      Excl. in GC? --    No data found.  Updated Vital Signs BP 108/72 (BP Location: Left Arm)   Pulse 80   Temp 98.4 F (36.9 C) (Oral)   Resp 18   Ht 5' 4.5" (1.638 m)   Wt 228 lb (103.4 kg)   LMP 09/20/2019 (Approximate)   SpO2 100%   BMI 38.53 kg/m       Physical Exam Vitals and nursing note reviewed.  Constitutional:      General: She is not in acute distress.    Appearance: Normal appearance. She is not ill-appearing or toxic-appearing.  HENT:     Head: Normocephalic and atraumatic.  Eyes:     General: No scleral icterus.       Right eye: No discharge.        Left eye: No discharge.     Conjunctiva/sclera: Conjunctivae normal.  Cardiovascular:     Rate and Rhythm: Normal rate.     Pulses: Normal pulses.  Pulmonary:     Effort: Pulmonary effort is normal. No respiratory distress.    Musculoskeletal:     Cervical back: Neck supple.     Right knee: Normal.     Left knee: Swelling (moderate swelling anterior knee) and bony tenderness (patella) present. No ecchymosis. Decreased range of motion (decreased flexion beyond 90 degrees). Tenderness present over the medial joint line and lateral joint line.     Comments: 5/5 strength bilat knee flexion and extension  Skin:    General: Skin is dry.  Neurological:     General: No focal deficit present.     Mental Status: She is alert. Mental status is at baseline.     Motor: No weakness.     Gait: Gait abnormal.  Psychiatric:        Mood and Affect: Mood normal.        Behavior: Behavior normal.        Thought Content: Thought content normal.      UC Treatments / Results  Labs (all labs ordered are listed, but only abnormal results are displayed) Labs Reviewed - No data to display  EKG   Radiology DG Knee Complete 4 Views Left  Result Date: 10/04/2019 CLINICAL DATA:  Pain and swelling EXAM: LEFT KNEE - COMPLETE 4+ VIEW COMPARISON:  None. FINDINGS: Mild lateral subluxation of the patella. No fracture is seen. The joint spaces are maintained. Suspected small moderate knee effusion. IMPRESSION: Mild lateral subluxation of the patella with small to moderate knee effusion. Electronically Signed   By: Jasmine Pang M.D.   On: 10/04/2019 18:42    Procedures Procedures (including critical care time)  Medications Ordered in UC Medications - No data to display  Initial Impression / Assessment and Plan / UC Course  I have reviewed the triage vital signs and the nursing notes.  Pertinent labs & imaging results that were available during my care of the patient were reviewed by me and considered in my medical decision making (see chart for details).    Patient's x-ray consistent with mild subluxation of the patella of the left knee.  Patient denies any injury.  Advised her to try to get the swelling down with icing the knee  every couple of hours and taking NSAIDs.  She can also take Tylenol for pain  relief patient fitted with a neoprene hinged knee brace.  Work note given for couple of days.  Advised her that if she is not feeling better before she is supposed to go back to work she should follow-up with orthopedics for further imaging of the knee.  There is some concern that she could have torn a ligament or meniscus due to the significant swelling.  She should follow-up sooner with Ortho if any symptoms worsen including the pain or if she has any weakness or falls due to knee instability.  Patient understanding and agreeable.  Final Clinical Impressions(s) / UC Diagnoses   Final diagnoses:  Subluxation of left knee, initial encounter  Effusion of bursa of left knee  Acute pain of left knee     Discharge Instructions     At this time you should rest and ice the knee.  Take NSAIDs as prescribed.  He can also take Tylenol for pain relief.  Try to keep knee elevated and wear the brace.  If you are not feeling better over the next few days you should follow-up with orthopedics.  You have a condition requiring you to follow up with Orthopedics so please call one of the following office for appointment:   [facility] [address] [place], [place] [address] Phone: [phone]  [facility] [address], [place], [place] [address] Phone: [phone]     ED Prescriptions    Medication Sig Dispense Auth. Provider   naproxen (NAPROSYN) 500 MG tablet Take 1 tablet (500 mg total) by mouth 2 (two) times daily for 15 days. 30 tablet Gareth Morgan     PDMP not reviewed this encounter.   Eusebio Friendly B, PA-C 10/05/19 [phone]

## 2019-10-04 NOTE — ED Triage Notes (Signed)
Patient in today c/o left knee pain and swelling x 2 weeks, getting progressively worse. No injury noted. Patient has not iced her knee or taken any OTC medications.

## 2019-10-04 NOTE — Discharge Instructions (Signed)
At this time you should rest and ice the knee.  Take NSAIDs as prescribed.  He can also take Tylenol for pain relief.  Try to keep knee elevated and wear the brace.  If you are not feeling better over the next few days you should follow-up with orthopedics.  You have a condition requiring you to follow up with Orthopedics so please call one of the following office for appointment:   [facility] [address] [place], [place] [address] Phone: [phone]  [facility] [address], [place], [place] [address] Phone: [phone]

## 2019-10-20 ENCOUNTER — Ambulatory Visit
Admission: EM | Admit: 2019-10-20 | Discharge: 2019-10-20 | Disposition: A | Discharge status: Home / Self Care | Payer: PRIVATE HEALTH INSURANCE | Attending: Emergency Medicine | Admitting: Emergency Medicine

## 2019-10-20 DIAGNOSIS — Z113 Encounter for screening for infections with a predominantly sexual mode of transmission: Secondary | ICD-10-CM

## 2019-10-20 DIAGNOSIS — N898 Other specified noninflammatory disorders of vagina: Secondary | ICD-10-CM

## 2019-10-20 LAB — POCT URINALYSIS DIP (MANUAL ENTRY)
Bilirubin, UA: NEGATIVE
Glucose, UA: NEGATIVE mg/dL
Ketones, POC UA: NEGATIVE mg/dL
Leukocytes, UA: NEGATIVE
Nitrite, UA: NEGATIVE
Spec Grav, UA: >=1.03 — AB (ref 1.010–1.025)
Urobilinogen, UA: 0.2 E.U./dL
pH, UA: 6 (ref 5.0–8.0)

## 2019-10-20 LAB — POCT URINE PREGNANCY: Preg Test, Ur: NEGATIVE

## 2019-10-20 MED ORDER — CEFTRIAXONE SODIUM 500 MG IJ SOLR
500.0000 mg | Freq: Once | INTRAMUSCULAR | Status: AC
  Administered 2019-10-20: 500 mg via INTRAMUSCULAR

## 2019-10-20 MED ORDER — DOXYCYCLINE HYCLATE 100 MG PO CAPS: 100.0000 mg | ORAL_CAPSULE | Freq: Two times a day (BID) | ORAL | 0 refills | Status: DC

## 2019-10-20 MED ORDER — METRONIDAZOLE 500 MG PO TABS: 500.0000 mg | ORAL_TABLET | Freq: Two times a day (BID) | ORAL | 0 refills | Status: DC

## 2019-10-20 NOTE — ED Provider Notes (Signed)
Alison Hall    CSN: [id number] Arrival date & time: 10/20/19  8921      History   Chief Complaint Chief Complaint  Patient presents with  . Vaginal odor    HPI Alison Hall is a 24 y.o. female.   Presents with 2-week history of vaginal odor and small amount of yellow vaginal discharge.  She is sexually active and does not use condoms.  She denies fever, chills, rash, lesions, abdominal pain, dysuria, back pain, pelvic pain, or other symptoms.  No treatments attempted at home.  The history is provided by the patient.    History reviewed. No pertinent past medical history.  There are no problems to display for this patient.   Past Surgical History:  Procedure Laterality Date  . NO PAST SURGERIES      OB History   No obstetric history on file.      Home Medications    Prior to Admission medications   Medication Sig Start Date End Date Taking? Authorizing Provider  doxycycline (VIBRAMYCIN) 100 MG capsule Take 1 capsule (100 mg total) by mouth 2 (two) times daily for 7 days. 10/20/19 10/27/19  Mickie Bail, NP  loperamide (IMODIUM) 2 MG capsule Take 1 capsule (2 mg total) by mouth 4 (four) times daily as needed for diarrhea or loose stools. 09/27/19   [name], NP  metroNIDAZOLE (FLAGYL) 500 MG tablet Take 1 tablet (500 mg total) by mouth 2 (two) times daily. 10/20/19   Mickie Bail, NP  ondansetron (ZOFRAN) 4 MG tablet Take 1 tablet (4 mg total) by mouth every 6 (six) hours. 09/27/19   [name], NP    Family History Family History  Problem Relation Age of Onset  . Diabetes Mother   . Other Father        unknown medical history    Social History Social History   Tobacco Use  . Smoking status: Never Smoker  . Smokeless tobacco: Never Used  Vaping Use  . Vaping Use: Never used  Substance Use Topics  . Alcohol use: No  . Drug use: No     Allergies   Patient has no known allergies.   Review of Systems Review of Systems    Constitutional: Negative for chills and fever.  HENT: Negative for ear pain and sore throat.   Eyes: Negative for pain and visual disturbance.  Respiratory: Negative for cough and shortness of breath.   Cardiovascular: Negative for chest pain and palpitations.  Gastrointestinal: Negative for abdominal pain and vomiting.  Genitourinary: Positive for vaginal discharge. Negative for dysuria, flank pain, hematuria and pelvic pain.  Musculoskeletal: Negative for arthralgias and back pain.  Skin: Negative for color change and rash.  Neurological: Negative for seizures and syncope.  All other systems reviewed and are negative.    Physical Exam Triage Vital Signs ED Triage Vitals [10/20/19 0928]  Enc Vitals Group     BP      Pulse      Resp      Temp      Temp src      SpO2      Weight      Height      Head Circumference      Peak Flow      Pain Score 0     Pain Loc      Pain Edu?      Excl. in GC?    No data found.  Updated Vital Signs  BP 106/72   Pulse 95   Temp 98.1 F (36.7 C)   Resp 16   LMP 10/06/2019 (Exact Date)   SpO2 97%   Visual Acuity Right Eye Distance:   Left Eye Distance:   Bilateral Distance:    Right Eye Near:   Left Eye Near:    Bilateral Near:     Physical Exam Vitals and nursing note reviewed.  Constitutional:      General: She is not in acute distress.    Appearance: She is well-developed.  HENT:     Head: Normocephalic and atraumatic.     Mouth/Throat:     Mouth: Mucous membranes are moist.     Pharynx: Oropharynx is clear.  Eyes:     Conjunctiva/sclera: Conjunctivae normal.  Cardiovascular:     Rate and Rhythm: Normal rate and regular rhythm.     Heart sounds: No murmur heard.   Pulmonary:     Effort: Pulmonary effort is normal. No respiratory distress.     Breath sounds: Normal breath sounds.  Abdominal:     Palpations: Abdomen is soft.     Tenderness: There is no abdominal tenderness. There is no right CVA tenderness, left  CVA tenderness, guarding or rebound.  Musculoskeletal:     Cervical back: Neck supple.  Skin:    General: Skin is warm and dry.     Findings: No rash.  Neurological:     General: No focal deficit present.     Mental Status: She is alert and oriented to person, place, and time.     Gait: Gait normal.  Psychiatric:        Mood and Affect: Mood normal.        Behavior: Behavior normal.      UC Treatments / Results  Labs (all labs ordered are listed, but only abnormal results are displayed) Labs Reviewed  POCT URINALYSIS DIP (MANUAL ENTRY) - Abnormal; Notable for the following components:      Result Value   Spec Grav, UA >=1.030 (*)    Blood, UA trace-intact (*)    Protein Ur, POC trace (*)    All other components within normal limits  POCT URINE PREGNANCY  CERVICOVAGINAL ANCILLARY ONLY    EKG   Radiology No results found.  Procedures Procedures (including critical care time)  Medications Ordered in UC Medications  cefTRIAXone (ROCEPHIN) injection 500 mg (has no administration in time range)    Initial Impression / Assessment and Plan / UC Course  I have reviewed the triage vital signs and the nursing notes.  Pertinent labs & imaging results that were available during my care of the patient were reviewed by me and considered in my medical decision making (see chart for details).   Vaginal discharge.  Screening for STDs.  Patient obtained vaginal self swab for testing.  Treated with Rocephin here.  Instructed patient to take metronidazole and doxycycline as directed.  Instructed her to abstain from sexual activity for 7 days.  Discussed with patient that she and her sexual partner may require additional treatment if the test results are positive.  Patient agrees to plan of care.     Final Clinical Impressions(s) / UC Diagnoses   Final diagnoses:  Vaginal discharge  Screening examination for STD (sexually transmitted disease)     Discharge Instructions       You were treated with an antibiotic today called Rocephin.  Take metronidazole twice a day for 7 days and doxycycline twice a day for  7 days.    Do not have sex for 7 days.  Your vaginal tests are pending.  If your test results are positive, we will call you.  You may need additional treatment and your partner(s) may also need treatment.            ED Prescriptions    Medication Sig Dispense Auth. Provider   doxycycline (VIBRAMYCIN) 100 MG capsule Take 1 capsule (100 mg total) by mouth 2 (two) times daily for 7 days. 14 capsule Mickie Bail, NP   metroNIDAZOLE (FLAGYL) 500 MG tablet Take 1 tablet (500 mg total) by mouth 2 (two) times daily. 14 tablet Mickie Bail, NP     PDMP not reviewed this encounter.   Mickie Bail, NP 10/20/19 1001

## 2019-10-20 NOTE — ED Triage Notes (Signed)
Patient reports she noticed a vaginal odor 2 weeks ago. Believed it to be d/t her soap, but she switched products and the smell has not gone away. Also reports a small amount of discharge on toilet tissue after voiding.

## 2019-10-20 NOTE — Discharge Instructions (Addendum)
You were treated with an antibiotic today called Rocephin.  Take metronidazole twice a day for 7 days and doxycycline twice a day for 7 days.    Do not have sex for 7 days.  Your vaginal tests are pending.  If your test results are positive, we will call you.  You may need additional treatment and your partner(s) may also need treatment.       

## 2019-10-23 ENCOUNTER — Telehealth: Payer: Self-pay

## 2019-10-23 DIAGNOSIS — N760 Acute vaginitis: Secondary | ICD-10-CM

## 2019-10-23 LAB — CERVICOVAGINAL ANCILLARY ONLY
Bacterial Vaginitis (gardnerella): POSITIVE — AB
Candida Glabrata: NEGATIVE
Candida Vaginitis: NEGATIVE
Chlamydia: NEGATIVE
Comment: NEGATIVE
Comment: NEGATIVE
Comment: NEGATIVE
Comment: NEGATIVE
Comment: NEGATIVE
Comment: NORMAL
Neisseria Gonorrhea: NEGATIVE
Trichomonas: NEGATIVE

## 2019-10-23 MED ORDER — METRONIDAZOLE 500 MG PO TABS: 500.0000 mg | ORAL_TABLET | Freq: Two times a day (BID) | ORAL | 0 refills | Status: DC

## 2020-02-09 ENCOUNTER — Ambulatory Visit: Payer: PRIVATE HEALTH INSURANCE | Attending: Family

## 2020-02-09 DIAGNOSIS — Z23 Encounter for immunization: Secondary | ICD-10-CM

## 2020-04-08 ENCOUNTER — Ambulatory Visit
Admission: EM | Admit: 2020-04-08 | Discharge: 2020-04-08 | Disposition: A | Discharge status: Home / Self Care | Payer: PRIVATE HEALTH INSURANCE | Attending: Family Medicine | Admitting: Family Medicine

## 2020-04-08 ENCOUNTER — Other Ambulatory Visit: Payer: Self-pay

## 2020-04-08 ENCOUNTER — Ambulatory Visit: Payer: Self-pay

## 2020-04-08 DIAGNOSIS — N898 Other specified noninflammatory disorders of vagina: Secondary | ICD-10-CM

## 2020-04-08 MED ORDER — METRONIDAZOLE 500 MG PO TABS: 500.0000 mg | ORAL_TABLET | Freq: Two times a day (BID) | ORAL | 0 refills | Status: AC

## 2020-04-08 NOTE — ED Provider Notes (Signed)
[name]    CSN: [id number] Arrival date & time: 04/08/20  8182      History   Chief Complaint Chief Complaint  Patient presents with  . Vaginal Discharge    HPI Alison Hall is a 25 y.o. female.   Patient is a 25 year old female who presents today with yellow vaginal discharge, "fishy odor" in nature for approx 1 week. Pt states h/o BV. Denies vaginal bleeding, abdominal pain, dysuria sx, n/v/d, fever.  No OTC meds for symptoms   Vaginal Discharge   History reviewed. No pertinent past medical history.  There are no problems to display for this patient.   Past Surgical History:  Procedure Laterality Date  . NO PAST SURGERIES      OB History   No obstetric history on file.      Home Medications    Prior to Admission medications   Medication Sig Start Date End Date Taking? Authorizing Provider  metroNIDAZOLE (FLAGYL) 500 MG tablet Take 1 tablet (500 mg total) by mouth 2 (two) times daily. 04/08/20  Yes Gresham Caetano A, NP  Prenatal Vit-Fe Fumarate-FA (PRENATAL VITAMINS) 28-0.8 MG TABS Take 1 tablet by mouth daily. 03/14/20  Yes [provider]  loperamide (IMODIUM) 2 MG capsule Take 1 capsule (2 mg total) by mouth 4 (four) times daily as needed for diarrhea or loose stools. 09/27/19   [name], NP    Family History Family History  Problem Relation Age of Onset  . Diabetes Mother   . Other Father        unknown medical history    Social History Social History   Tobacco Use  . Smoking status: Never Smoker  . Smokeless tobacco: Never Used  Vaping Use  . Vaping Use: Never used  Substance Use Topics  . Alcohol use: No  . Drug use: No     Allergies   Patient has no known allergies.   Review of Systems Review of Systems  Genitourinary: Positive for vaginal discharge.     Physical Exam Triage Vital Signs ED Triage Vitals  Enc Vitals Group     BP 04/08/20 0920 109/63     Pulse Rate 04/08/20 0920 72     Resp  04/08/20 0920 18     Temp 04/08/20 0920 98.6 F (37 C)     Temp Source 04/08/20 0920 Oral     SpO2 04/08/20 0920 98 %     Weight --      Height --      Head Circumference --      Peak Flow --      Pain Score 04/08/20 0922 0     Pain Loc --      Pain Edu? --      Excl. in [place]? --    No data found.  Updated Vital Signs BP 109/63 (BP Location: Right Arm)   Pulse 72   Temp 98.6 F (37 C) (Oral)   Resp 18   LMP 03/21/2020   SpO2 98%   Visual Acuity Right Eye Distance:   Left Eye Distance:   Bilateral Distance:    Right Eye Near:   Left Eye Near:    Bilateral Near:     Physical Exam Vitals and nursing note reviewed.  Constitutional:      General: She is not in acute distress.    Appearance: Normal appearance. She is not ill-appearing, toxic-appearing or diaphoretic.  HENT:     Head: Normocephalic.  Eyes:  Conjunctiva/sclera: Conjunctivae normal.  Pulmonary:     Effort: Pulmonary effort is normal.  Musculoskeletal:        General: Normal range of motion.     Cervical back: Normal range of motion.  Skin:    General: Skin is warm and dry.     Findings: No rash.  Neurological:     Mental Status: She is alert.  Psychiatric:        Mood and Affect: Mood normal.      UC Treatments / Results  Labs (all labs ordered are listed, but only abnormal results are displayed) Labs Reviewed  CERVICOVAGINAL ANCILLARY ONLY    EKG   Radiology No results found.  Procedures Procedures (including critical care time)  Medications Ordered in UC Medications - No data to display  Initial Impression / Assessment and Plan / UC Course  I have reviewed the triage vital signs and the nursing notes.  Pertinent labs & imaging results that were available during my care of the patient were reviewed by me and considered in my medical decision making (see chart for details).     Vaginal discharge Treating for BV based on hx and symptoms.  [facility] sent for confirmation.   Follow up as needed for continued or worsening symptoms  Final Clinical Impressions(s) / UC Diagnoses   Final diagnoses:  Vaginal discharge     Discharge Instructions     Take the medication as prescribed Take with food.  Swab results pending.  Follow up as needed for continued or worsening symptoms     ED Prescriptions    Medication Sig Dispense Auth. Provider   metroNIDAZOLE (FLAGYL) 500 MG tablet Take 1 tablet (500 mg total) by mouth 2 (two) times daily. 14 tablet Davon Folta A, NP     PDMP not reviewed this encounter.   Janace Aris, NP 04/08/20 [phone]

## 2020-04-08 NOTE — Discharge Instructions (Addendum)
Take the medication as prescribed Take with food.  Swab results pending.  Follow up as needed for continued or worsening symptoms

## 2020-04-08 NOTE — ED Triage Notes (Signed)
Pt c/o yellow vaginal discharge, "fishy odor" in nature for approx 1 week. Pt states h/o BV.   Denies vaginal bleeding, abdominal pain, dysuria sx, n/v/d, fever.  No OTC meds for symptoms Denies likelihood of pregnancy

## 2020-04-09 LAB — CERVICOVAGINAL ANCILLARY ONLY
Bacterial Vaginitis (gardnerella): POSITIVE — AB
Candida Glabrata: NEGATIVE
Candida Vaginitis: NEGATIVE
Chlamydia: NEGATIVE
Comment: NEGATIVE
Comment: NEGATIVE
Comment: NEGATIVE
Comment: NEGATIVE
Comment: NEGATIVE
Comment: NORMAL
Neisseria Gonorrhea: NEGATIVE
Trichomonas: NEGATIVE

## 2020-06-10 NOTE — Progress Notes (Signed)
   Covid-19 Vaccination Clinic  Name:  [name]    MRN: [id number] DOB: [birth year]  06/10/2020  [name] was observed post Covid-19 immunization for 15 minutes without incident. She was provided with Vaccine Information Sheet and instruction to access the V-Safe system.   [name] was instructed to call 911 with any severe reactions post vaccine: Marland Kitchen Difficulty breathing  . Swelling of face and throat  . A fast heartbeat  . A bad rash all over body  . Dizziness and weakness   Immunizations Administered    Name Date Dose VIS Date Route   Moderna Covid-19 Booster Vaccine 02/09/2020 10:45 AM 0.25 mL 11/15/2019 Intramuscular   Manufacturer: Moderna   Lot: [id number]   NDC: 21308-657-84

## 2020-08-15 ENCOUNTER — Ambulatory Visit: Payer: Self-pay

## 2020-08-15 ENCOUNTER — Other Ambulatory Visit: Payer: Self-pay

## 2020-08-15 ENCOUNTER — Ambulatory Visit
Admission: EM | Admit: 2020-08-15 | Discharge: 2020-08-15 | Disposition: A | Discharge status: Home / Self Care | Payer: BC Managed Care – PPO | Attending: Family Medicine | Admitting: Family Medicine

## 2020-08-15 DIAGNOSIS — Z1152 Encounter for screening for COVID-19: Secondary | ICD-10-CM | POA: Diagnosis not present

## 2020-08-15 NOTE — Discharge Instructions (Signed)

## 2020-08-15 NOTE — ED Triage Notes (Signed)
Pt had a +home covid test. Not having symptoms now and needs a test for work.

## 2020-08-16 LAB — SARS-COV-2, NAA 2 DAY TAT

## 2020-08-16 LAB — NOVEL CORONAVIRUS, NAA: SARS-CoV-2, NAA: DETECTED — AB

## 2021-02-26 ENCOUNTER — Other Ambulatory Visit: Payer: Self-pay

## 2021-02-26 ENCOUNTER — Emergency Department
Admission: EM | Admit: 2021-02-26 | Discharge: 2021-02-26 | Disposition: A | Discharge status: Home / Self Care | Payer: Worker's Compensation | Attending: Emergency Medicine | Admitting: Emergency Medicine

## 2021-02-26 ENCOUNTER — Emergency Department: Payer: Worker's Compensation | Attending: Emergency Medicine

## 2021-02-26 DIAGNOSIS — S0083XA Contusion of other part of head, initial encounter: Secondary | ICD-10-CM | POA: Insufficient documentation

## 2021-02-26 DIAGNOSIS — W228XXA Striking against or struck by other objects, initial encounter: Secondary | ICD-10-CM | POA: Diagnosis not present

## 2021-02-26 DIAGNOSIS — W1839XA Other fall on same level, initial encounter: Secondary | ICD-10-CM | POA: Insufficient documentation

## 2021-02-26 DIAGNOSIS — S0990XA Unspecified injury of head, initial encounter: Secondary | ICD-10-CM | POA: Diagnosis present
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
## 2021-07-11 ENCOUNTER — Ambulatory Visit: Payer: Self-pay | Admitting: Nurse Practitioner

## 2022-04-07 IMAGING — CT CT HEAD W/O CM
4 series · 17 of 47 positions shown, 19 images · non-contrast
Comparison: None.

CLINICAL DATA: Fall, head injury, vomiting



[Series 2: head wo · axial · 0.41mm/px · z∈[-104,+6]mm · 7 of 30 slices shown, 9 images]
[im 4/30  brain]
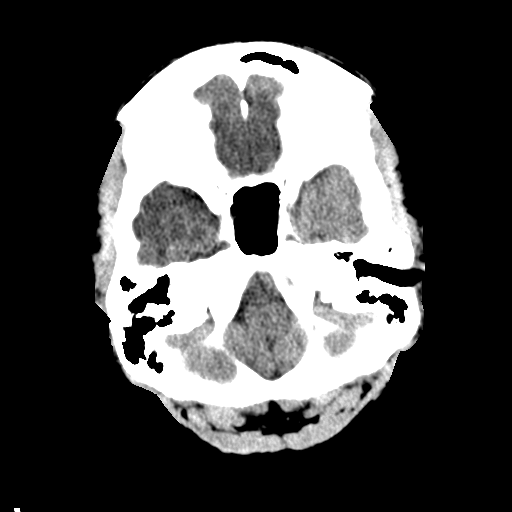
[im 4/30  bone]
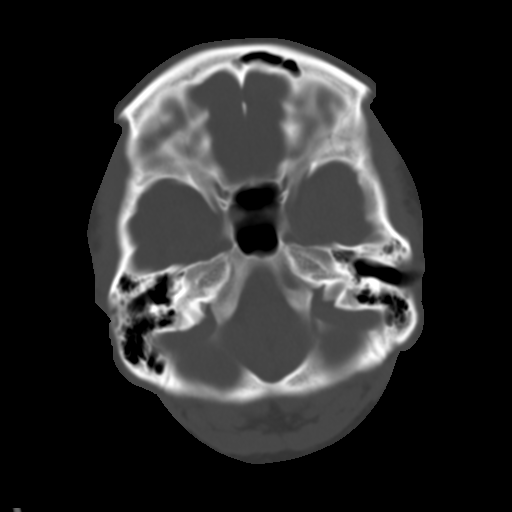
[im 8/30  brain]
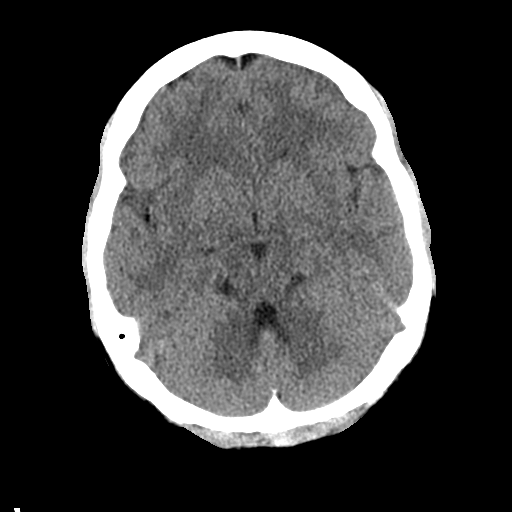
[im 11/30  brain]
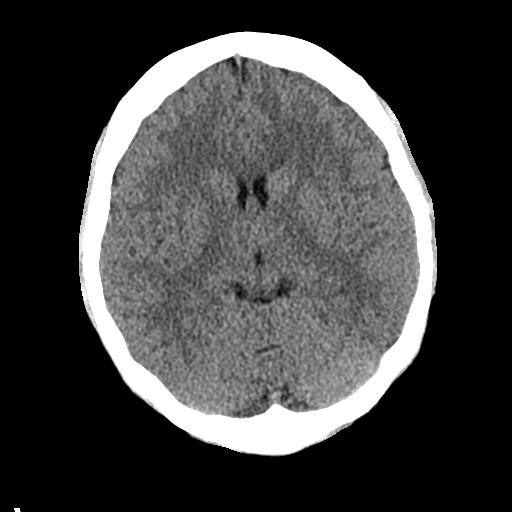
[im 15/30  brain]
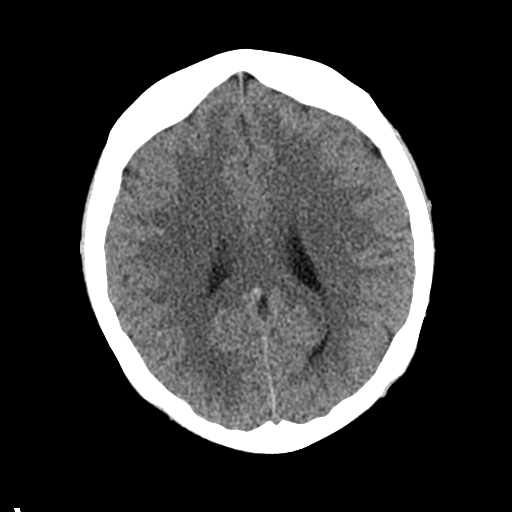
[im 19/30  brain]
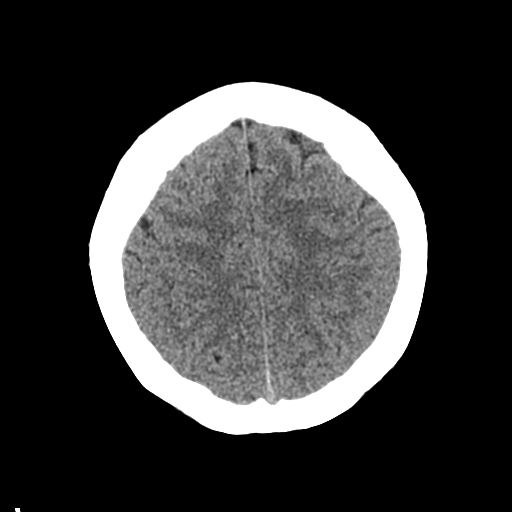
[im 19/30  bone]
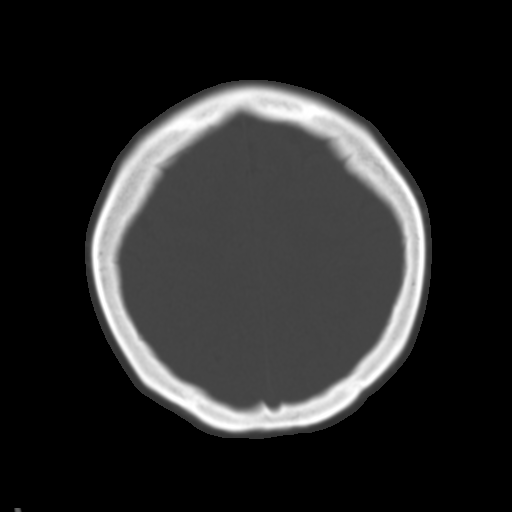
[im 22/30  brain]
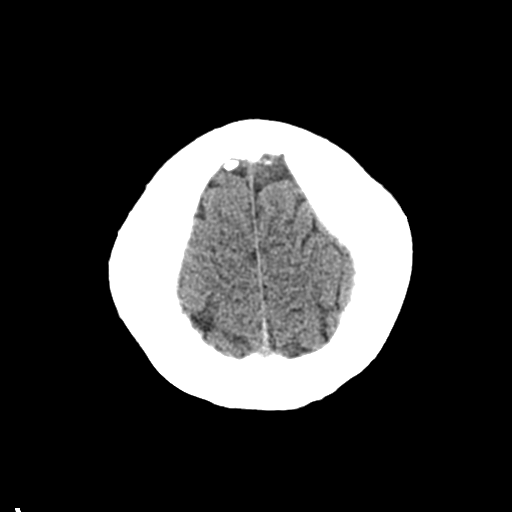
[im 26/30  brain]
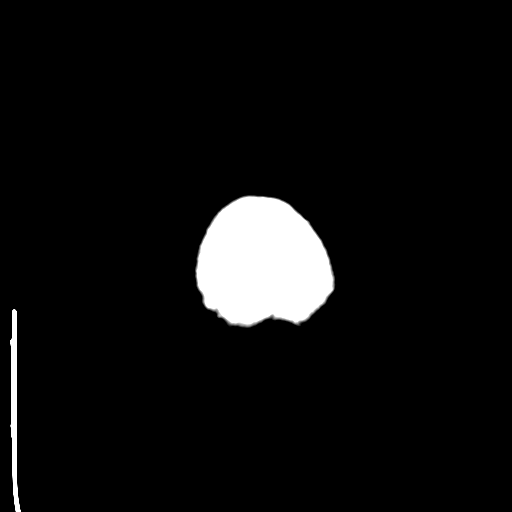

[Series 3: head bone · axial · 0.41mm/px · z∈[-105,-55]mm · 4 of 73 slices shown]
[im 8/73  bone]
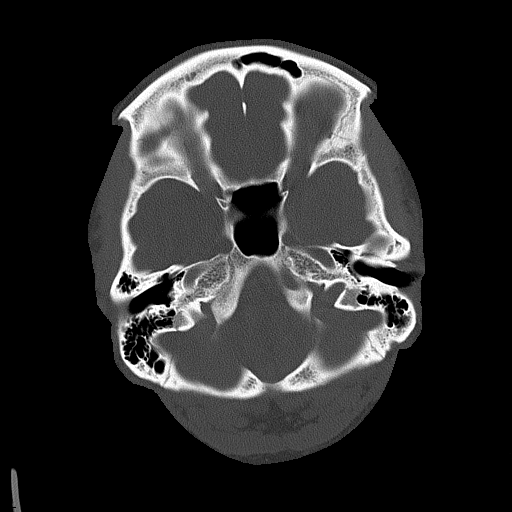
[im 15/73  bone]
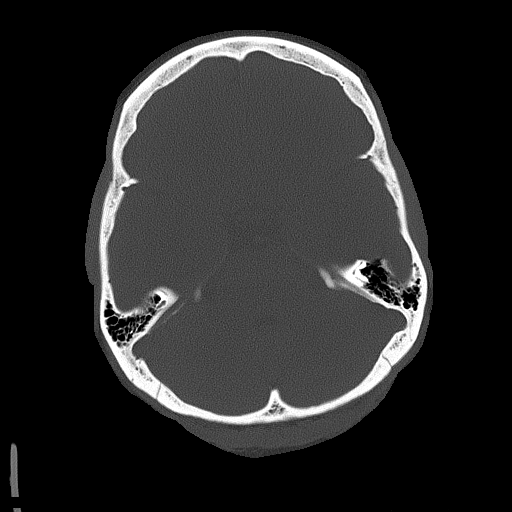
[im 22/73  bone]
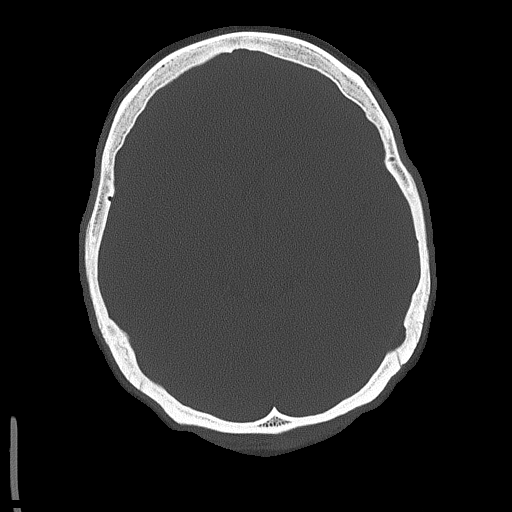
[im 33/73  bone]
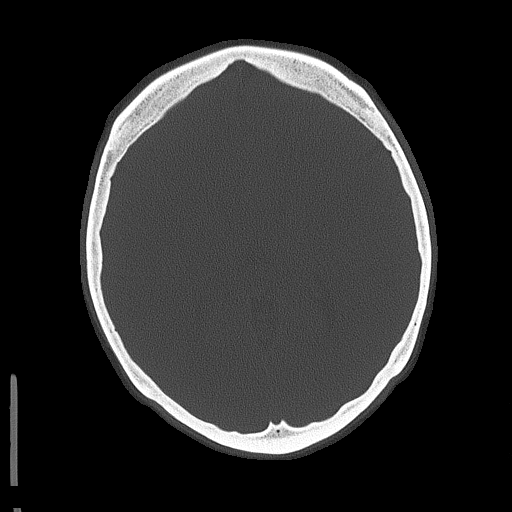

[Series 4: coronal soft tissue · coronal · 0.30mm/px · 3 of 62 slices shown]
[im 21/62  brain]
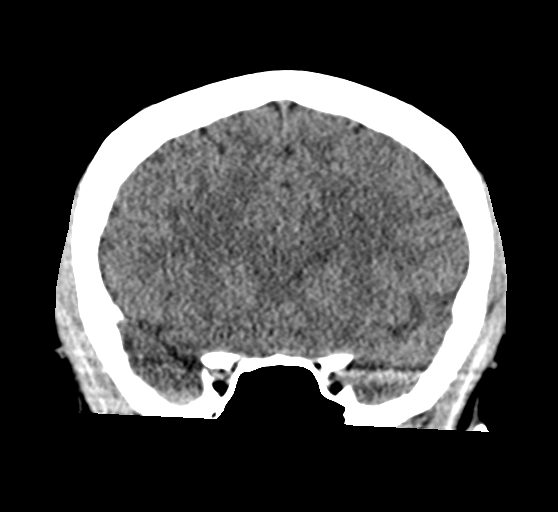
[im 28/62  brain]
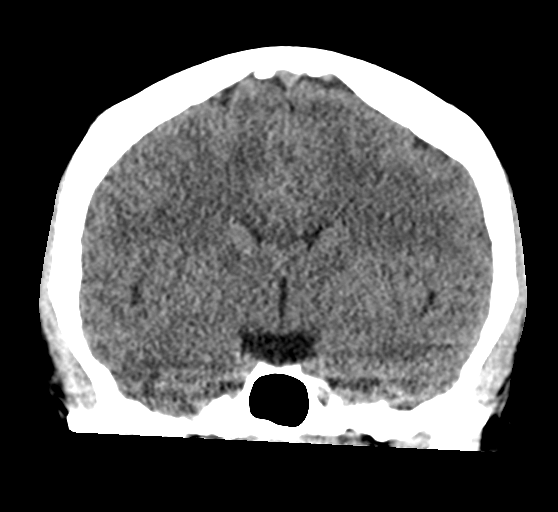
[im 34/62  brain]
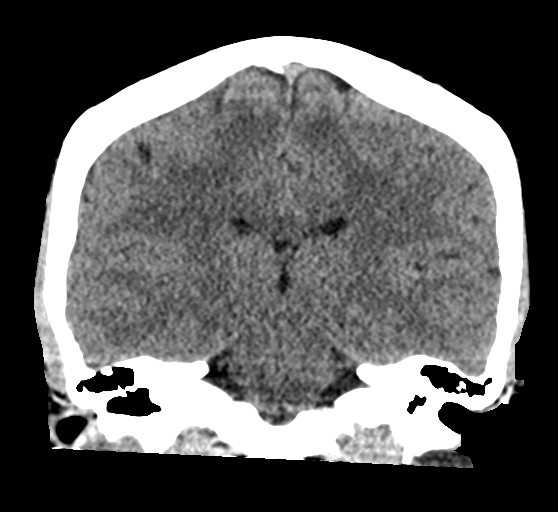

[Series 5: sagittal soft tissue · sagittal · 0.30mm/px · 3 of 54 slices shown]
[im 18/54  brain]
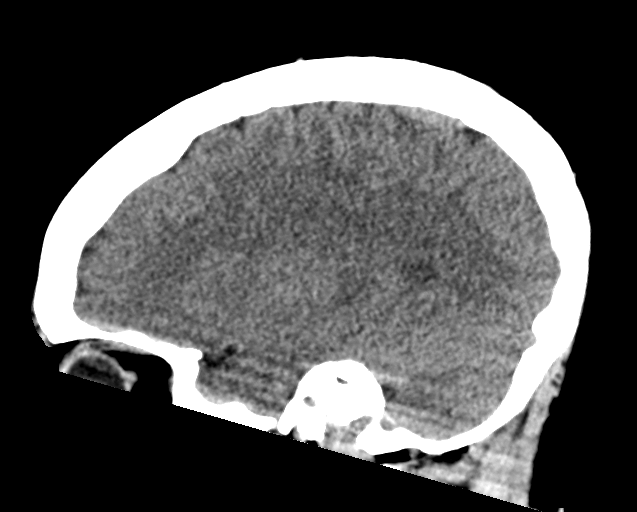
[im 27/54  brain]
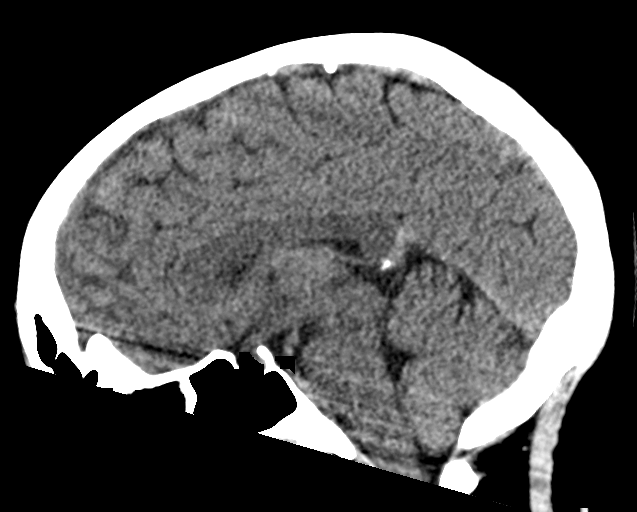
[im 36/54  brain]
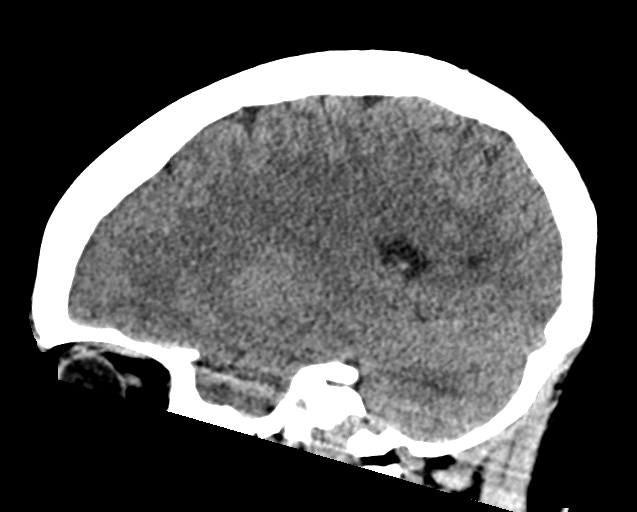

[17 of 47 positions shown; findings below may reference images not displayed]

FINDINGS: Brain: No evidence of acute infarction, hemorrhage, hydrocephalus,
extra-axial collection or mass lesion/mass effect.

Vascular: No hyperdense vessel or unexpected calcification.

Skull: Normal. Negative for fracture or focal lesion.

Sinuses/Orbits: The visualized paranasal sinuses are essentially
clear. The mastoid air cells are unopacified.

Other: None.
IMPRESSION: Normal head CT.

## 2023-02-07 ENCOUNTER — Ambulatory Visit: Payer: Self-pay

## 2023-02-16 ENCOUNTER — Ambulatory Visit: Payer: BC Managed Care – PPO | Admitting: Internal Medicine
